# Patient Record
Sex: Female | Born: 1990 | Race: White | Hispanic: No | Marital: Married | Smoking: Never smoker
Health system: Southern US, Community
[De-identification: ages and names within clinical notes are randomized; demographics above are authoritative.]

---

## 2016-06-13 ENCOUNTER — Emergency Department (HOSPITAL_COMMUNITY): Payer: BLUE CROSS/BLUE SHIELD

## 2016-06-13 ENCOUNTER — Emergency Department (HOSPITAL_COMMUNITY)
Admission: EM | Admit: 2016-06-13 | Discharge: 2016-06-14 | Disposition: A | Discharge status: Home / Self Care | Payer: BLUE CROSS/BLUE SHIELD | Attending: Emergency Medicine | Admitting: Emergency Medicine

## 2016-06-13 ENCOUNTER — Encounter (HOSPITAL_COMMUNITY): Payer: Self-pay | Admitting: Emergency Medicine

## 2016-06-13 DIAGNOSIS — R1033 Periumbilical pain: Secondary | ICD-10-CM | POA: Insufficient documentation

## 2016-06-13 DIAGNOSIS — R102 Pelvic and perineal pain: Secondary | ICD-10-CM | POA: Insufficient documentation

## 2016-06-13 DIAGNOSIS — R112 Nausea with vomiting, unspecified: Secondary | ICD-10-CM | POA: Diagnosis not present

## 2016-06-13 LAB — COMPREHENSIVE METABOLIC PANEL
ALBUMIN: 4.6 g/dL (ref 3.5–5.0)
ALT: 25 U/L (ref 14–54)
AST: 17 U/L (ref 15–41)
Alkaline Phosphatase: 88 U/L (ref 38–126)
Anion gap: 8 (ref 5–15)
BUN: 11 mg/dL (ref 6–20)
CHLORIDE: 103 mmol/L (ref 101–111)
CO2: 26 mmol/L (ref 22–32)
Calcium: 9.4 mg/dL (ref 8.9–10.3)
Creatinine, Ser: 0.89 mg/dL (ref 0.44–1.00)
GFR calc Af Amer: >60 mL/min (ref >60)
GLUCOSE: 79 mg/dL (ref 65–99)
POTASSIUM: 3.5 mmol/L (ref 3.5–5.1)
Sodium: 137 mmol/L (ref 135–145)
Total Bilirubin: 0.7 mg/dL (ref 0.3–1.2)
Total Protein: 8.1 g/dL (ref 6.5–8.1)

## 2016-06-13 LAB — URINALYSIS, ROUTINE W REFLEX MICROSCOPIC
BILIRUBIN URINE: NEGATIVE
Glucose, UA: NEGATIVE mg/dL
Hgb urine dipstick: NEGATIVE
Ketones, ur: NEGATIVE mg/dL
Leukocytes, UA: NEGATIVE
Nitrite: NEGATIVE
Protein, ur: NEGATIVE mg/dL
SPECIFIC GRAVITY, URINE: 1.008 (ref 1.005–1.030)
pH: 6 (ref 5.0–8.0)

## 2016-06-13 LAB — CBC WITH DIFFERENTIAL/PLATELET
BASOS ABS: 0 10*3/uL (ref 0.0–0.1)
Basophils Relative: 0 %
EOS PCT: 2 %
Eosinophils Absolute: 0.2 10*3/uL (ref 0.0–0.7)
HEMATOCRIT: 39.9 % (ref 36.0–46.0)
Hemoglobin: 13 g/dL (ref 12.0–15.0)
LYMPHS ABS: 2.3 10*3/uL (ref 0.7–4.0)
LYMPHS PCT: 27 %
MCH: 26.9 pg (ref 26.0–34.0)
MCHC: 32.6 g/dL (ref 30.0–36.0)
MCV: 82.4 fL (ref 78.0–100.0)
MONO ABS: 0.8 10*3/uL (ref 0.1–1.0)
MONOS PCT: 9 %
NEUTROS ABS: 5.1 10*3/uL (ref 1.7–7.7)
Neutrophils Relative %: 62 %
PLATELETS: 318 10*3/uL (ref 150–400)
RBC: 4.84 MIL/uL (ref 3.87–5.11)
RDW: 14.4 % (ref 11.5–15.5)
WBC: 8.4 10*3/uL (ref 4.0–10.5)

## 2016-06-13 LAB — WET PREP, GENITAL
CLUE CELLS WET PREP: NONE SEEN
SPERM: NONE SEEN
TRICH WET PREP: NONE SEEN
YEAST WET PREP: NONE SEEN

## 2016-06-13 LAB — LIPASE, BLOOD: LIPASE: 31 U/L (ref 11–51)

## 2016-06-13 LAB — PREGNANCY, URINE: Preg Test, Ur: NEGATIVE

## 2016-06-13 MED ORDER — SODIUM CHLORIDE 0.9 % IV SOLN
INTRAVENOUS | Status: DC
  Administered 2016-06-13: 21:00:00 via INTRAVENOUS

## 2016-06-13 MED ORDER — HYDROCODONE-ACETAMINOPHEN 5-325 MG PO TABS: ORAL_TABLET | ORAL | 0 refills | Status: AC

## 2016-06-13 MED ORDER — IOPAMIDOL (ISOVUE-300) INJECTION 61%
INTRAVENOUS | Status: AC
  Administered 2016-06-13: 30 mL
  Filled 2016-06-13: qty 30

## 2016-06-13 MED ORDER — IOPAMIDOL (ISOVUE-300) INJECTION 61%
100.0000 mL | Freq: Once | INTRAVENOUS | Status: AC | PRN
  Administered 2016-06-13: 100 mL via INTRAVENOUS

## 2016-06-13 MED ORDER — ONDANSETRON 4 MG PO TBDP: 4.0000 mg | ORAL_TABLET | Freq: Three times a day (TID) | ORAL | 0 refills | Status: AC | PRN

## 2016-06-13 MED ORDER — MORPHINE SULFATE (PF) 4 MG/ML IV SOLN
4.0000 mg | INTRAVENOUS | Status: DC | PRN
  Administered 2016-06-13: 4 mg via INTRAVENOUS
  Filled 2016-06-13: qty 1

## 2016-06-13 MED ORDER — ONDANSETRON HCL 4 MG/2ML IJ SOLN
4.0000 mg | INTRAMUSCULAR | Status: DC | PRN
  Administered 2016-06-13: 4 mg via INTRAVENOUS
  Filled 2016-06-13: qty 2

## 2016-06-13 NOTE — ED Provider Notes (Signed)
[facility]-EMERGENCY DEPT Provider Note   CSN: [id number] Arrival date & time: 06/13/16  1941     History   Chief Complaint Chief Complaint  Patient presents with  . Abdominal Pain    HPI Sharon Roth is a 26 y.o. female.  HPI  Pt was seen at 2000.  Per pt, c/o gradual onset and worsening of persistent abd "pain" since this morning.  Has been associated with nausea and one episode of vomiting. Describes the abd pain as located around her umbilicus, and radiating into her right side of her abd.  Denies diarrhea, no fevers, no back pain, no rash, no CP/SOB, no black or blood in stools or emesis, no injury.      History reviewed. No pertinent past medical history.  There are no active problems to display for this patient.   History reviewed. No pertinent surgical history.  OB History    No data available       Home Medications    Prior to Admission medications   Not on File    Family History History reviewed. No pertinent family history.  Social History Social History  Substance Use Topics  . Smoking status: Never Smoker  . Smokeless tobacco: Never Used  . Alcohol use Not on file     Allergies   Patient has no allergy information on record.   Review of Systems Review of Systems ROS: Statement: All systems negative except as marked or noted in the HPI; Constitutional: Negative for fever and chills. ; ; Eyes: Negative for eye pain, redness and discharge. ; ; ENMT: Negative for ear pain, hoarseness, nasal congestion, sinus pressure and sore throat. ; ; Cardiovascular: Negative for chest pain, palpitations, diaphoresis, dyspnea and peripheral edema. ; ; Respiratory: Negative for cough, wheezing and stridor. ; ; Gastrointestinal: +N/V, abd pain. Negative for diarrhea, blood in stool, hematemesis, jaundice and rectal bleeding. . ; ; Genitourinary: Negative for dysuria, flank pain and hematuria. ; ; Musculoskeletal: Negative for back pain and neck pain. Negative for swelling  and trauma.; ; Skin: Negative for pruritus, rash, abrasions, blisters, bruising and skin lesion.; ; Neuro: Negative for headache, lightheadedness and neck stiffness. Negative for weakness, altered level of consciousness, altered mental status, extremity weakness, paresthesias, involuntary movement, seizure and syncope.       Physical Exam Updated Vital Signs BP (!) 134/92   Pulse 87   Temp 98.7 F (37.1 C)   Resp 18   Ht  (1.778 m)   Wt 208 lb 3.2 oz (94.4 kg)   LMP 05/15/2016   SpO2 100%   BMI 29.87 kg/m   Physical Exam 2005: Physical examination:  Nursing notes reviewed; Vital signs and O2 SAT reviewed;  Constitutional: Well developed, Well nourished, Well hydrated, In no acute distress; Head:  Normocephalic, atraumatic; Eyes: EOMI, PERRL, No scleral icterus; ENMT: Mouth and pharynx normal, Mucous membranes moist; Neck: Supple, Full range of motion, No lymphadenopathy; Cardiovascular: Regular rate and rhythm, No gallop; Respiratory: Breath sounds clear & equal bilaterally, No wheezes.  Speaking full sentences with ease, Normal respiratory effort/excursion; Chest: Nontender, Movement normal; Abdomen: Soft, +periumbilical, RUQ and RLQ tenderness to palp. No palp hernias. Nondistended, Normal bowel sounds; Genitourinary: No CVA tenderness. Pelvic exam performed with permission of pt and female ED tech assist during exam.  External genitalia w/o lesions. Vaginal vault without discharge.  Cervix w/o lesions, not friable, GC/chlam and wet prep obtained and sent to lab.  Bimanual exam w/o CMT, uterine or adnexal tenderness.; Extremities: Pulses  normal, No tenderness, No edema, No calf edema or asymmetry.; Neuro: AA&Ox3, Major CN grossly intact.  Speech clear. No gross focal motor or sensory deficits in extremities.; Skin: Color normal, Warm, Dry.   ED Treatments / Results  Labs (all labs ordered are listed, but only abnormal results are displayed)   EKG  EKG Interpretation None         Radiology   Procedures Procedures (including critical care time)  Medications Ordered in ED Medications  morphine 4 MG/ML injection 4 mg (not administered)  ondansetron (ZOFRAN) injection 4 mg (not administered)  0.9 %  sodium chloride infusion (not administered)     Initial Impression / Assessment and Plan / ED Course  I have reviewed the triage vital signs and the nursing notes.  Pertinent labs & imaging results that were available during my care of the patient were reviewed by me and considered in my medical decision making (see chart for details).  MDM Reviewed: nursing note and vitals Interpretation: labs and CT scan   Results for orders placed or performed during the hospital encounter of 06/13/16  Wet prep, genital  Result Value Ref Range   Yeast Wet Prep HPF POC NONE SEEN NONE SEEN   Trich, Wet Prep NONE SEEN NONE SEEN   Clue Cells Wet Prep HPF POC NONE SEEN NONE SEEN   WBC, Wet Prep HPF POC RARE (A) NONE SEEN   Sperm NONE SEEN   Comprehensive metabolic panel  Result Value Ref Range   Sodium 137 135 - 145 mmol/L   Potassium 3.5 3.5 - 5.1 mmol/L   Chloride 103 101 - 111 mmol/L   CO2 26 22 - 32 mmol/L   Glucose, Bld 79 65 - 99 mg/dL   BUN 11 6 - 20 mg/dL   Creatinine, Ser 1.61 0.44 - 1.00 mg/dL   Calcium 9.4 8.9 - 09.6 mg/dL   Total Protein 8.1 6.5 - 8.1 g/dL   Albumin 4.6 3.5 - 5.0 g/dL   AST 17 15 - 41 U/L   ALT 25 14 - 54 U/L   Alkaline Phosphatase 88 38 - 126 U/L   Total Bilirubin 0.7 0.3 - 1.2 mg/dL   GFR calc non Af Amer >60 >60 mL/min   GFR calc Af Amer >60 >60 mL/min   Anion gap 8 5 - 15  Lipase, blood  Result Value Ref Range   Lipase 31 11 - 51 U/L  CBC with Differential  Result Value Ref Range   WBC 8.4 4.0 - 10.5 K/uL   RBC 4.84 3.87 - 5.11 MIL/uL   Hemoglobin 13.0 12.0 - 15.0 g/dL   HCT 04.5 40.9 - 81.1 %   MCV 82.4 78.0 - 100.0 fL   MCH 26.9 26.0 - 34.0 pg   MCHC 32.6 30.0 - 36.0 g/dL   RDW 91.4 78.2 - 95.6 %   Platelets 318 150  - 400 K/uL   Neutrophils Relative % 62 %   Neutro Abs 5.1 1.7 - 7.7 K/uL   Lymphocytes Relative 27 %   Lymphs Abs 2.3 0.7 - 4.0 K/uL   Monocytes Relative 9 %   Monocytes Absolute 0.8 0.1 - 1.0 K/uL   Eosinophils Relative 2 %   Eosinophils Absolute 0.2 0.0 - 0.7 K/uL   Basophils Relative 0 %   Basophils Absolute 0.0 0.0 - 0.1 K/uL  Pregnancy, urine  Result Value Ref Range   Preg Test, Ur NEGATIVE NEGATIVE  Urinalysis, Routine w reflex microscopic  Result Value Ref Range  Color, Urine STRAW (A) YELLOW   APPearance CLEAR CLEAR   Specific Gravity, Urine 1.008 1.005 - 1.030   pH 6.0 5.0 - 8.0   Glucose, UA NEGATIVE NEGATIVE mg/dL   Hgb urine dipstick NEGATIVE NEGATIVE   Bilirubin Urine NEGATIVE NEGATIVE   Ketones, ur NEGATIVE NEGATIVE mg/dL   Protein, ur NEGATIVE NEGATIVE mg/dL   Nitrite NEGATIVE NEGATIVE   Leukocytes, UA NEGATIVE NEGATIVE   Ct Abdomen Pelvis W Contrast Result Date: 06/13/2016 CLINICAL DATA:  26 y/o  F; abdominal pain with nausea and vomiting. EXAM: CT ABDOMEN AND PELVIS WITH CONTRAST TECHNIQUE: Multidetector CT imaging of the abdomen and pelvis was performed using the standard protocol following bolus administration of intravenous contrast. CONTRAST:  ISOVUE-300 IOPAMIDOL (ISOVUE-300) INJECTION 61%, 30mL ISOVUE-300 IOPAMIDOL (ISOVUE-300) INJECTION 61% COMPARISON:  None. FINDINGS: Lower chest: 4 mm right lower lobe triangular nodule (series 4, image 4) likely representing intrapulmonary lymph node. Hepatobiliary: No focal liver abnormality is seen. No gallstones, gallbladder wall thickening, or biliary dilatation. Pancreas: Unremarkable. No pancreatic ductal dilatation or surrounding inflammatory changes. Spleen: Normal in size without focal abnormality. Adrenals/Urinary Tract: Adrenal glands are unremarkable. Kidneys are normal, without renal calculi, focal lesion, or hydronephrosis. Bladder is unremarkable. Stomach/Bowel: Stomach is within normal limits. Appendix  appears normal. No evidence of bowel wall thickening, distention, or inflammatory changes. Vascular/Lymphatic: No significant vascular findings are present. No enlarged abdominal or pelvic lymph nodes. Reproductive: Uterus and bilateral adnexa are unremarkable. IUD well seated within uterine fundus. Other: No abdominal wall hernia or abnormality. No abdominopelvic ascites. Musculoskeletal: No acute or significant osseous findings. IMPRESSION: No acute process identified as explanation for abdominal pain. Unremarkable CT of abdomen and pelvis for age. Electronically Signed   By: Mitzi Hansen M.D.   On: 06/13/2016 21:29   US Transvaginal Non-ob Result Date: 06/13/2016 CLINICAL DATA:  Umbilical pain times 12 hours EXAM: TRANSABDOMINAL AND TRANSVAGINAL ULTRASOUND OF PELVIS DOPPLER ULTRASOUND OF OVARIES TECHNIQUE: Both transabdominal and transvaginal ultrasound examinations of the pelvis were performed. Transabdominal technique was performed for global imaging of the pelvis including uterus, ovaries, adnexal regions, and pelvic cul-de-sac. It was necessary to proceed with endovaginal exam following the transabdominal exam to visualize the left ovary. Color and duplex Doppler ultrasound was utilized to evaluate blood flow to the ovaries. COMPARISON:  CT from 06/13/2016 FINDINGS: Uterus Measurements: 7.3 x 4.2 x 5.5. No fibroids or other mass visualized. Endometrium Thickness: 11.1 mm. No focal abnormality visualized. An intrauterine device is seen within the endometrial cavity. No apparent perforation identified. Right ovary Measurements: 2.6 x 3.1 x 2.8 cm. Normal appearance/no adnexal mass. Left ovary Measurements: 3.3 x 2.8 x 2.7 cm. Several adjacent follicles are noted within the left ovary, the largest 1.8 x 1.3 x 1.8 cm. Pulsed Doppler evaluation of both ovaries demonstrates normal low-resistance arterial and venous waveforms. Other findings No abnormal free fluid. IMPRESSION: Small follicles of the  left ovary the largest 1.8 x 1.3 x 1.8 cm. Intrauterine device is seen within the expected location of the endometrial cavity. No definite perforation identified. Electronically Signed   By: Tollie Eth M.D.   On: 06/13/2016 23:42     2145:  Abd remains TTP. Workup reassuring. Will obtain US pelvis to r/o torsion.   2355:  US pelvis also reassuring. Long d/w pt regarding dx testing results today, strict return precautions. Pt verb understanding. Dx and testing d/w pt and family.  Questions answered.  Verb understanding, agreeable to d/c home with outpt f/u.  Final Clinical Impressions(s) / ED Diagnoses   Final diagnoses:  None    New Prescriptions New Prescriptions   No medications on file     Samuel Jester, DO 06/18/16 1020

## 2016-06-13 NOTE — ED Triage Notes (Signed)
Pt c/o abd pain around belly button that started with am. Pt also c/o nausea and has vomited once.

## 2016-06-13 NOTE — Discharge Instructions (Signed)
Take the prescriptions as directed.  Increase your fluid intake (ie:  Gatoraide) for the next few days.  Eat a bland diet and advance to your regular diet slowly as you can tolerate it. Call your regular medical doctor this morning to schedule a follow up appointment in the next 24 to 48 hours.  Return to the Emergency Department immediately if not improving (or even worsening) despite taking the medicines as prescribed, any black or bloody stool or vomit, if you develop a fever over "101," or for any other concerns.

## 2016-06-15 LAB — GC/CHLAMYDIA PROBE AMP (~~LOC~~) NOT AT ARMC
CHLAMYDIA, DNA PROBE: NEGATIVE
NEISSERIA GONORRHEA: NEGATIVE

## 2017-02-22 ENCOUNTER — Encounter (HOSPITAL_COMMUNITY): Payer: Self-pay | Admitting: *Deleted

## 2017-02-22 DIAGNOSIS — R39198 Other difficulties with micturition: Secondary | ICD-10-CM | POA: Diagnosis not present

## 2017-02-22 DIAGNOSIS — N9489 Other specified conditions associated with female genital organs and menstrual cycle: Secondary | ICD-10-CM | POA: Insufficient documentation

## 2017-02-22 LAB — BASIC METABOLIC PANEL
Anion gap: 11 (ref 5–15)
BUN: 14 mg/dL (ref 6–20)
CO2: 26 mmol/L (ref 22–32)
CREATININE: 0.96 mg/dL (ref 0.44–1.00)
Calcium: 9.5 mg/dL (ref 8.9–10.3)
Chloride: 105 mmol/L (ref 101–111)
GFR calc Af Amer: >60 mL/min (ref >60)
Glucose, Bld: 96 mg/dL (ref 65–99)
Potassium: 3.8 mmol/L (ref 3.5–5.1)
SODIUM: 142 mmol/L (ref 135–145)

## 2017-02-22 LAB — CBC
HCT: 38.8 % (ref 36.0–46.0)
Hemoglobin: 12.5 g/dL (ref 12.0–15.0)
MCH: 27.4 pg (ref 26.0–34.0)
MCHC: 32.2 g/dL (ref 30.0–36.0)
MCV: 85.1 fL (ref 78.0–100.0)
PLATELETS: 262 10*3/uL (ref 150–400)
RBC: 4.56 MIL/uL (ref 3.87–5.11)
RDW: 13.7 % (ref 11.5–15.5)
WBC: 4.9 10*3/uL (ref 4.0–10.5)

## 2017-02-22 NOTE — ED Notes (Signed)
Jenay Morici Wiley RN bladder scanned pt. Pt had 28mL of urine in bladder

## 2017-02-22 NOTE — ED Triage Notes (Signed)
Pt reports she has not urinated since Friday morning, no nausea or vomiting, no fevers. Feeling the need to urinate comes and goes, tries to go, but no urination.  About an hour ago had onset of lower right back pain 2/10, no meds PTA.

## 2017-02-23 ENCOUNTER — Emergency Department (HOSPITAL_COMMUNITY)
Admission: EM | Admit: 2017-02-23 | Discharge: 2017-02-23 | Disposition: A | Discharge status: Home / Self Care | Payer: BLUE CROSS/BLUE SHIELD | Attending: Emergency Medicine | Admitting: Emergency Medicine

## 2017-02-23 DIAGNOSIS — R339 Retention of urine, unspecified: Secondary | ICD-10-CM

## 2017-02-23 DIAGNOSIS — R39198 Other difficulties with micturition: Secondary | ICD-10-CM

## 2017-02-23 LAB — URINALYSIS, ROUTINE W REFLEX MICROSCOPIC
Bacteria, UA: NONE SEEN
Bilirubin Urine: NEGATIVE
Glucose, UA: NEGATIVE mg/dL
KETONES UR: NEGATIVE mg/dL
Leukocytes, UA: NEGATIVE
Nitrite: NEGATIVE
PH: 5 (ref 5.0–8.0)
Protein, ur: NEGATIVE mg/dL
Specific Gravity, Urine: 1.026 (ref 1.005–1.030)

## 2017-02-23 LAB — PREGNANCY, URINE: Preg Test, Ur: NEGATIVE

## 2017-02-23 MED ORDER — CEPHALEXIN 500 MG PO CAPS: 500.0000 mg | ORAL_CAPSULE | Freq: Two times a day (BID) | ORAL | 0 refills | Status: AC

## 2017-02-23 NOTE — ED Notes (Signed)
Pt states she does not want in and out, gave pt water and advised to call out after she is able to urinate, pt states she thinks she will be able to urinate

## 2017-02-23 NOTE — ED Notes (Signed)
Pt able to urinate.

## 2017-02-23 NOTE — ED Provider Notes (Signed)
[facility] EMERGENCY DEPARTMENT Provider Note   CSN: [id number] Arrival date & time: 02/22/17  2023     History   Chief Complaint Chief Complaint  Patient presents with  . Urinary Retention    HPI Sharon CootsJeri Roth is a [age] y.o. female.  The history is provided by the patient.  Illness  This is a new problem. The current episode started more than 2 days ago. The problem occurs daily. The problem has been gradually worsening. Pertinent negatives include no chest pain and no abdominal pain. Nothing aggravates the symptoms. Nothing relieves the symptoms. She has tried nothing for the symptoms.  Patient reports she has had minimal urine output over the past several days She reports at times she feels like she has the urge to urinate, but is unable to pass any urine Has had brief flank pain but none at this time She denies fever/nausea/vomiting She reports she has been having her period recently No change in her bowel movements  PMH -none OB History    No data available       Home Medications    Prior to Admission medications   Medication Sig Start Date End Date Taking? Authorizing Provider  cephALEXin (KEFLEX) 500 MG capsule Take 1 capsule (500 mg total) by mouth 2 (two) times daily. 02/23/17   Zadie RhineWickline, Jordynn Perrier, MD  HYDROcodone-acetaminophen (NORCO/VICODIN) 5-325 MG tablet 1 or 2 tabs PO q6 hours prn pain 06/13/16   Samuel JesterMcManus, Kathleen, DO  ondansetron (ZOFRAN ODT) 4 MG disintegrating tablet Take 1 tablet (4 mg total) by mouth every 8 (eight) hours as needed for nausea or vomiting. 06/13/16   Samuel JesterMcManus, Kathleen, DO    Family History No family history on file.  Social History Social History   Tobacco Use  . Smoking status: Never Smoker  . Smokeless tobacco: Never Used  Substance Use Topics  . Alcohol use: Not on file  . Drug use: Not on file     Allergies   Patient has no known allergies.   Review of Systems Review of Systems  Constitutional: Negative for fever.    Cardiovascular: Negative for chest pain.  Gastrointestinal: Negative for abdominal pain.  Genitourinary: Positive for decreased urine volume, difficulty urinating and vaginal bleeding.  All other systems reviewed and are negative.    Physical Exam Updated Vital Signs BP 109/66 (BP Location: Right Arm)   Pulse 61   Temp 98.1 F (36.7 C) (Oral)   Resp 18   Ht 1.778 m (5\' 10" )   Wt 94.8 kg (209 lb)   LMP 02/19/2017   SpO2 98%   BMI 29.99 kg/m   Physical Exam CONSTITUTIONAL: Well developed/well nourished HEAD: Normocephalic/atraumatic EYES: EOMI/PERRL ENMT: Mucous membranes moist NECK: supple no meningeal signs SPINE/BACK:entire spine nontender CV: S1/S2 noted, no murmurs/rubs/gallops noted LUNGS: Lungs are clear to auscultation bilaterally, no apparent distress ABDOMEN: soft, nontender, no rebound or guarding, bowel sounds noted throughout abdomen GU:no cva tenderness NEURO: Pt is awake/alert/appropriate, moves all extremitiesx4.  No facial droop.   EXTREMITIES: pulses normal/equal, full ROM SKIN: warm, color normal PSYCH: no abnormalities of mood noted, alert and oriented to situation   ED Treatments / Results  Labs (all labs ordered are listed, but only abnormal results are displayed) Labs Reviewed  URINALYSIS, ROUTINE W REFLEX MICROSCOPIC - Abnormal; Notable for the following components:      Result Value   APPearance HAZY (*)    Hgb urine dipstick MODERATE (*)    Squamous Epithelial / LPF 0-5 (*)  All other components within normal limits  BASIC METABOLIC PANEL  CBC  PREGNANCY, URINE    EKG  EKG Interpretation None       Radiology No results found.  Procedures Procedures (including critical care time)  Medications Ordered in ED Medications - No data to display   Initial Impression / Assessment and Plan / ED Course  I have reviewed the triage vital signs and the nursing notes.  Pertinent labs  results that were available during my care of  the patient were reviewed by me and considered in my medical decision making (see chart for details).     Claims to have minimal urine output over the past several days. On arrival here, her urine bladder volume was around 28 cc SHe claims to be taking plenty of p.o. Fluids In the ED she was able to urinate, all of her labs were unremarkable Due to feelings of frequency and difficulty urinating, will start on  oral antibiotic and follow-up with urology  Final Clinical Impressions(s) / ED Diagnoses   Final diagnoses:  Urinary retention  Difficulty urinating    ED Discharge Orders        Ordered    cephALEXin (KEFLEX) 500 MG capsule  2 times daily     02/23/17 0321       Zadie Rhine, MD 02/23/17 [phone]

## 2018-01-06 IMAGING — US US ART/VEN ABD/PELV/SCROTUM DOPPLER LTD
1 series · 13 of 25 positions shown · non-contrast
Comparison: CT from 06/13/2016

CLINICAL DATA: Umbilical pain times 12 hours

EXAM:
TRANSABDOMINAL AND TRANSVAGINAL ULTRASOUND OF PELVIS
DOPPLER ULTRASOUND OF OVARIES
TECHNIQUE: Both transabdominal and transvaginal ultrasound examinations of the
pelvis were performed. Transabdominal technique was performed for
global imaging of the pelvis including uterus, ovaries, adnexal
regions, and pelvic cul-de-sac.
It was necessary to proceed with endovaginal exam following the
transabdominal exam to visualize the left ovary. Color and duplex
Doppler ultrasound was utilized to evaluate blood flow to the
ovaries.

[Series 1: us art/ven abd/pelv/scrotum doppler ltd · 0.18mm/px · 13 of 81 slices shown]
[im 1/81]
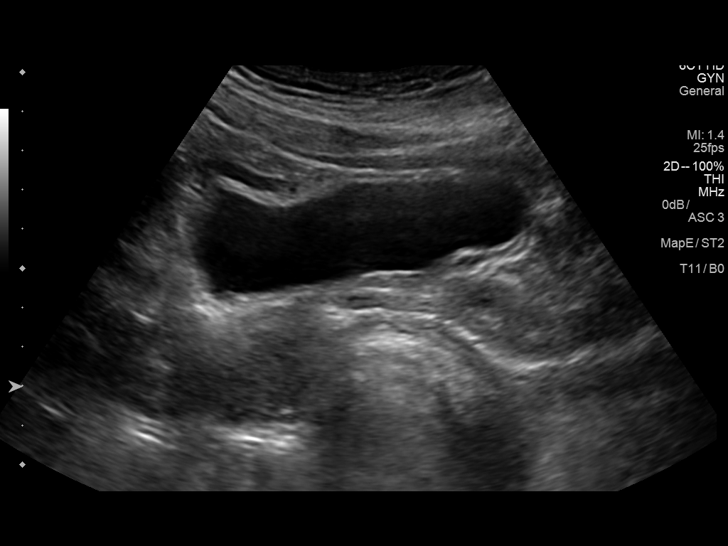
[im 7/81]
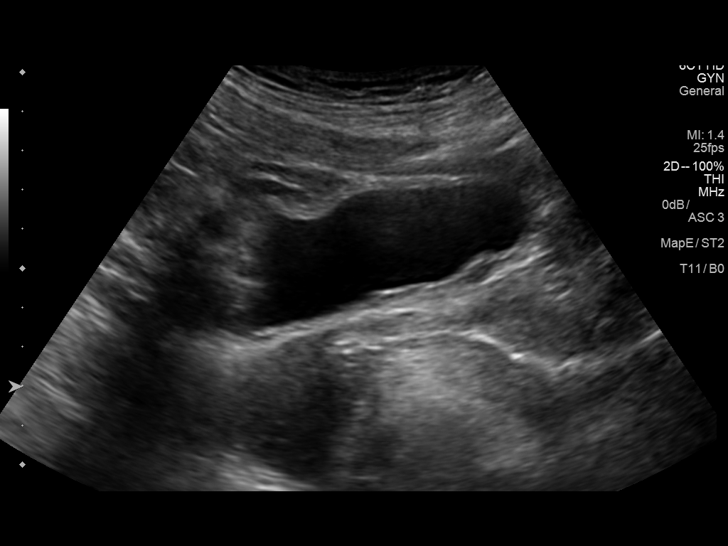
[im 14/81]
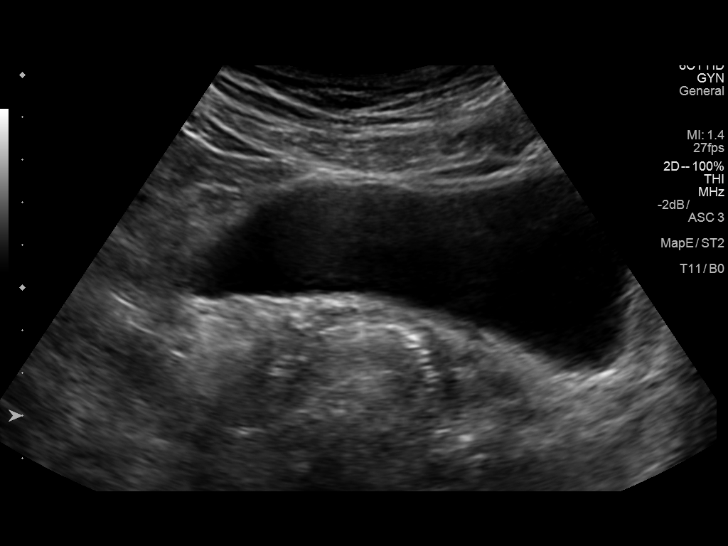
[im 21/81]
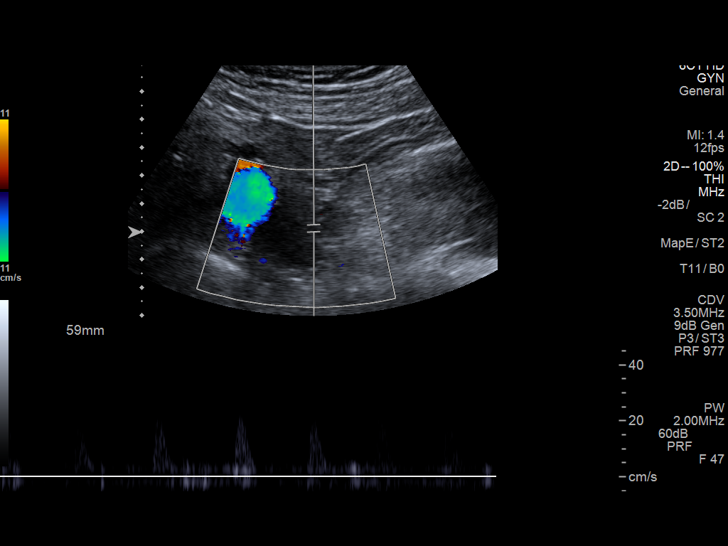
[im 27/81]
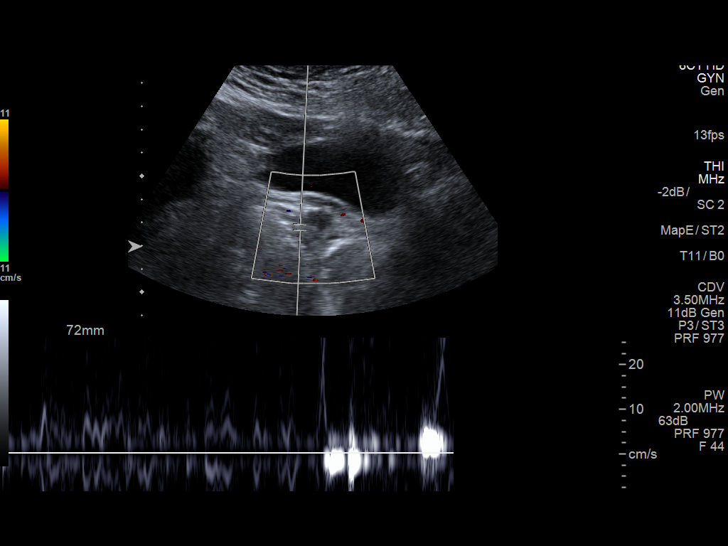
[im 34/81]
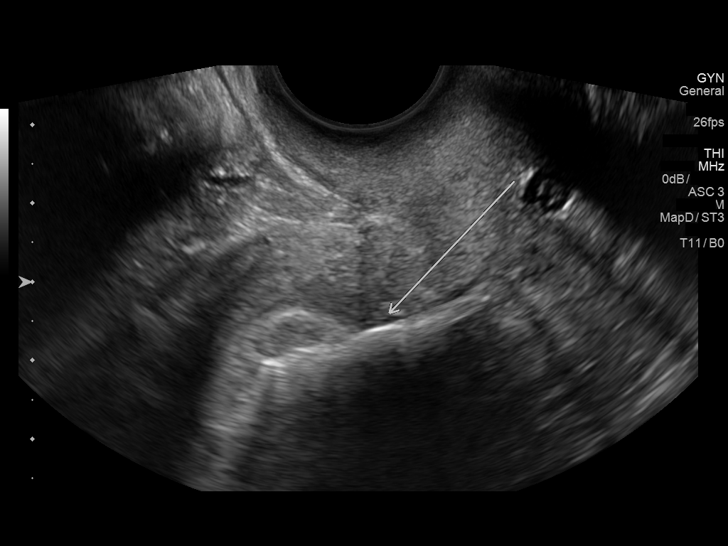
[im 41/81]
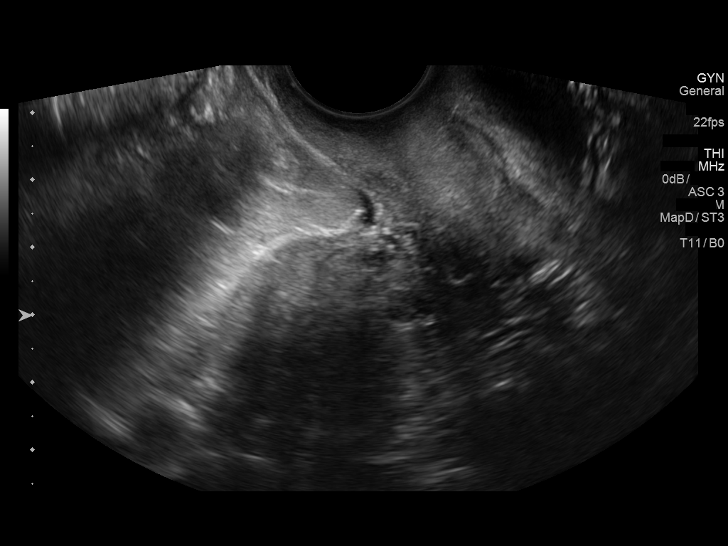
[im 47/81]
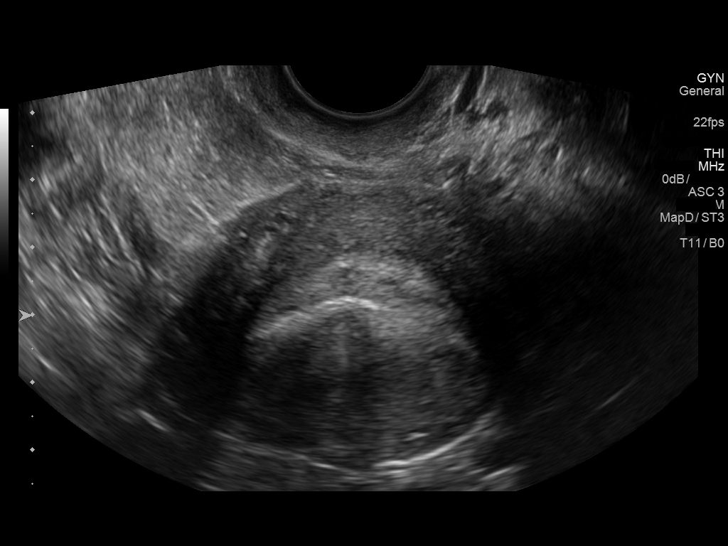
[im 54/81]
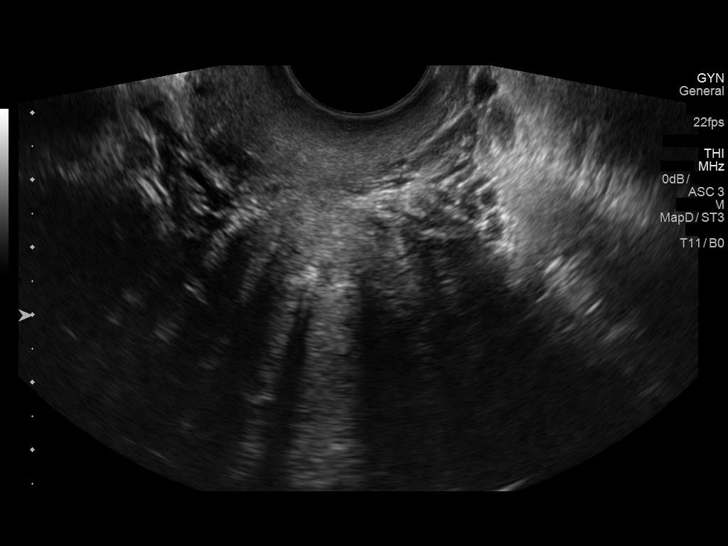
[im 61/81]
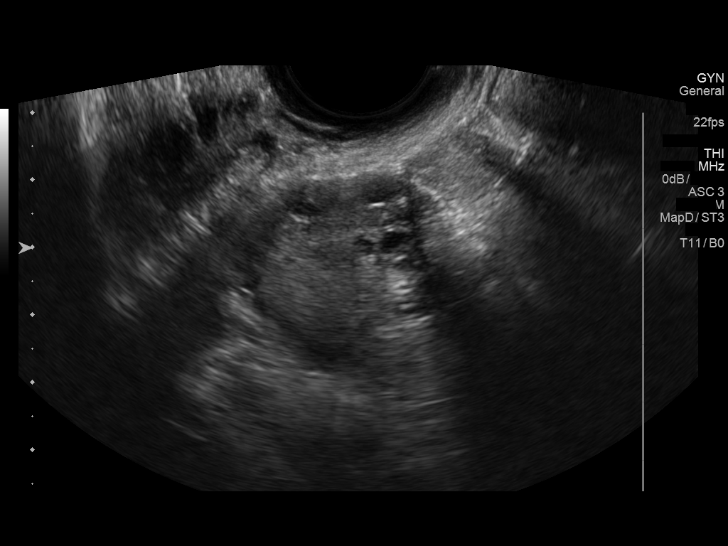
[im 67/81]
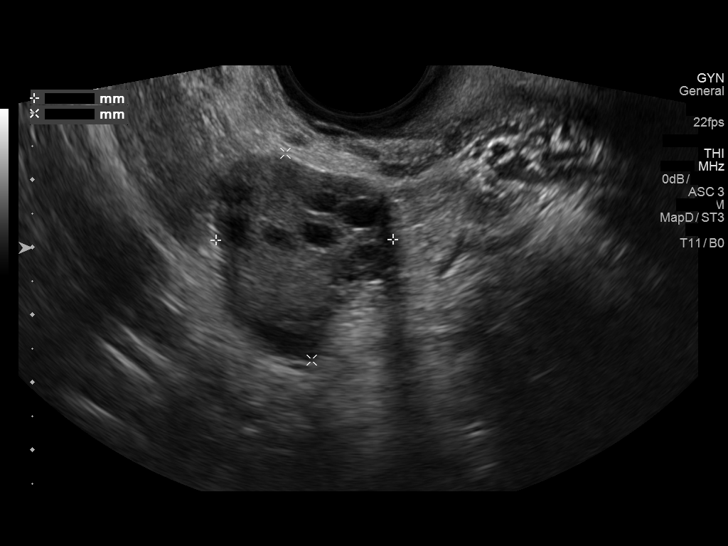
[im 74/81]
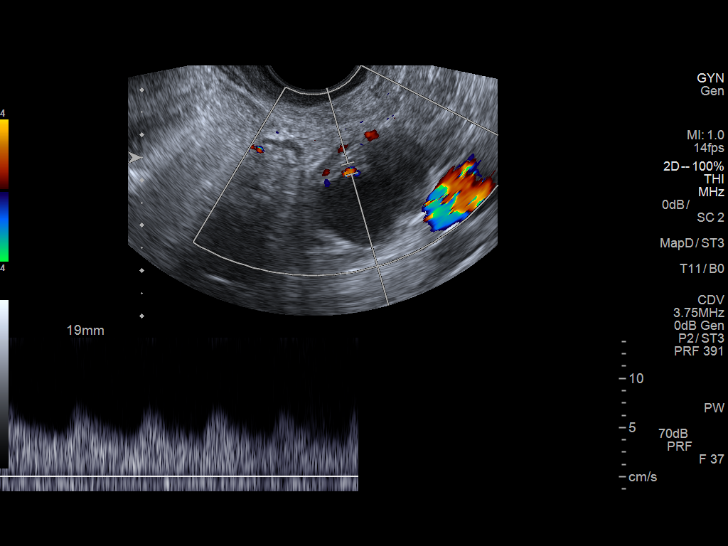
[im 81/81]
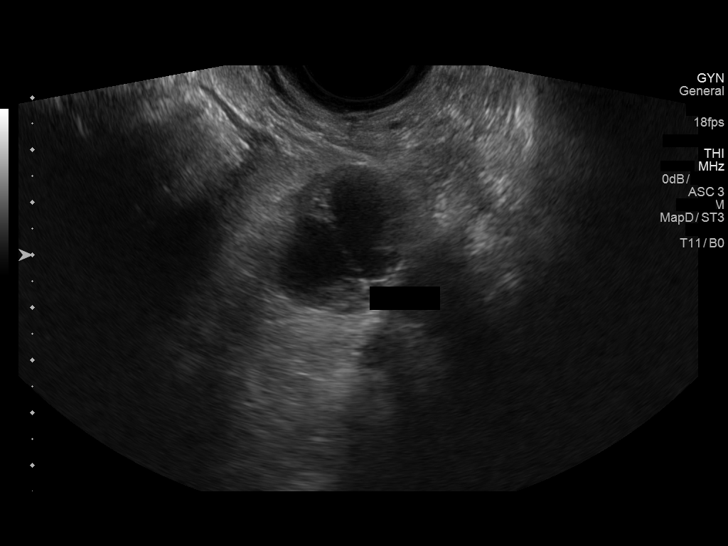

[13 of 25 positions shown; findings below may reference images not displayed]

FINDINGS: Uterus

Measurements: 7.3 x 4.2 x 5.5. No fibroids or other mass visualized.

Endometrium

Thickness: 11.1 mm. No focal abnormality visualized. An intrauterine
device is seen within the endometrial cavity. No apparent
perforation identified.

Right ovary

Measurements: 2.6 x 3.1 x 2.8 cm. Normal appearance/no adnexal mass.

Left ovary

Measurements: 3.3 x 2.8 x 2.7 cm. Several adjacent follicles are
noted within the left ovary, the largest 1.8 x 1.3 x 1.8 cm.

Pulsed Doppler evaluation of both ovaries demonstrates normal
low-resistance arterial and venous waveforms.

Other findings

No abnormal free fluid.
IMPRESSION: Small follicles of the left ovary the largest 1.8 x 1.3 x 1.8 cm.

Intrauterine device is seen within the expected location of the
endometrial cavity. No definite perforation identified.
